# Patient Record
Sex: Male | Born: 2000 | Race: Black or African American | Hispanic: No | Marital: Single | Smoking: Never smoker
Health system: Southern US, Community
[De-identification: ages and names within clinical notes are randomized; demographics above are authoritative.]

## PROBLEM LIST (undated history)

## (undated) DIAGNOSIS — R519 Headache, unspecified: Secondary | ICD-10-CM

## (undated) HISTORY — DX: Headache, unspecified: R51.9

---
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
## 2022-01-02 ENCOUNTER — Ambulatory Visit (INDEPENDENT_AMBULATORY_CARE_PROVIDER_SITE_OTHER): Payer: 59 | Admitting: Family

## 2022-01-02 ENCOUNTER — Encounter (INDEPENDENT_AMBULATORY_CARE_PROVIDER_SITE_OTHER): Payer: Self-pay | Admitting: Family

## 2022-01-02 VITALS — BP 120/60 | HR 60 | Ht 68.7 in | Wt 177.0 lb

## 2022-01-02 DIAGNOSIS — G43009 Migraine without aura, not intractable, without status migrainosus: Secondary | ICD-10-CM | POA: Diagnosis not present

## 2022-01-02 MED ORDER — SUMATRIPTAN SUCCINATE 25 MG PO TABS: ORAL_TABLET | ORAL | 5 refills | Status: DC

## 2022-01-02 NOTE — Patient Instructions (Signed)
It was a pleasure to see you today!  Instructions for you until your next appointment are as follows: Remember that it is important for you to avoid skipping meals, to drink plenty of water each day and to get at least 9 hours of sleep each night as these things are known to reduce how often headaches occur.   Continue to take Sumatriptan and Tylenol at the onset of migraine pain for best results.  Please sign up for MyChart if you have not done so. Please plan to return for follow up in 6 months or sooner if needed.  Feel free to contact our office during normal business hours at [phone] with questions or concerns. If there is no answer or the call is outside business hours, please leave a message and our clinic staff will call you back within the next business day.  If you have an urgent concern, please stay on the line for our after-hours answering service and ask for the on-call neurologist.     I also encourage you to use MyChart to communicate with me more directly. If you have not yet signed up for MyChart within [facility], the front desk staff can help you. However, please note that this inbox is NOT monitored on nights or weekends, and response can take up to 2 business days.  Urgent matters should be discussed with the on-call pediatric neurologist.   At [facility], we are committed to providing exceptional care. You will receive a patient satisfaction survey through text or email regarding your visit today. Your opinion is important to me. Comments are appreciated.

## 2022-01-02 NOTE — Progress Notes (Signed)
Larry Ramos   MRN:  [id number]  2000-03-06   Provider: Rockwell Germany NP-C Location of Care: [facility]  Visit type: Return visit  Last visit: 12/14/2021  Referral source: Normajean Baxter, MD History from: Epic chart and patient  Brief history:  Copied from previous record: History of migraine headaches since about 2020. With the headaches he has severe unilateral head pain, nausea and vomiting, and intolerance to light and sound. 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 Doing well academically, has transitioned well to college Migraines not problematic. When they occur, Sumatriptan works well to give him relief.  Works at not skipping meals, drinking water, and staying on a sleep schedule.  Bray has been otherwise generally healthy since he was last seen. No health concerns today other than previously mentioned.  Review of systems: Please see HPI for neurologic and other pertinent review of systems. Otherwise all other systems were reviewed and were negative.  Problem List: Patient Active Problem List   Diagnosis Date Noted   Migraine without aura and without status migrainosus, not intractable 05/19/2020   Sprain of right ankle, sequela 03/20/2017     Past Medical History:  Diagnosis Date   Headache     Past medical history comments: See HPI Copied from previous record: Birth history: Azad was born at term via normal spontaneous delivery weighing 5lbs 1oz at [facility] in [place]. There were no complications of pregnancy, labor or delivery. Development was recalled as normal.   Surgical history: Past Surgical History:  Procedure Laterality Date   wisdom teeth removal  03/31/2018     Family history: family history includes Migraines in his father and paternal grandfather.   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    He enjoys sports (basketball and football) playing video games, and drawing.    Social Determinants of Health   Financial Resource Strain: Not on file  Food Insecurity: Not on file  Transportation Needs: Not on file  Physical Activity: Not on file  Stress: Not on file  Social Connections: Not on file  Intimate Partner Violence: Not on file   Past/failed meds: Copied from previous record: Topiramate  - stopped it when headaches improved Clonidine - stopped it when sleep improved  Allergies: No Known Allergies   Immunizations: Immunization History  Administered Date(s) Administered   PFIZER(Purple Top)SARS-COV-2 Vaccination 06/20/2019, 07/20/2019    Diagnostics/Screenings: Copied from previous record: PHQ-SADS Score Only 05/19/2020  PHQ-15 5  GAD-7 3  Anxiety attacks No  PHQ-9 2  Suicidal Ideation No  Any difficulty  to complete tasks? Not difficult at all   Physical Exam: BP 120/60   Pulse 60   Ht 5' 8.7" (1.745 m)   Wt 177 lb 0.5 oz (80.3 kg)   BMI 26.37 kg/m   General: Well developed, well nourished young man, seated on exam table, in no evident distress Head: Head normocephalic and atraumatic.  Oropharynx benign. Neck: Supple Cardiovascular: Regular rate and rhythm, no murmurs Respiratory: Breath sounds clear to  auscultation Musculoskeletal: No obvious deformities or scoliosis Skin: No rashes or neurocutaneous lesions  Neurologic Exam Mental Status: Awake and fully alert.  Oriented to place and time.  Recent and remote memory intact.  Attention span, concentration, and fund of knowledge appropriate.  Mood and affect appropriate. Cranial Nerves: Fundoscopic exam reveals sharp disc margins.  Pupils equal, briskly reactive to light.  Extraocular movements full without nystagmus. Hearing intact and symmetric to whisper.  Facial sensation intact.  Face tongue, palate move normally and symmetrically. Shoulder shrug normal Motor: Normal bulk and tone. Normal strength in all tested extremity muscles. Sensory: Intact to touch and temperature in all extremities.  Coordination: Rapid alternating movements normal in all extremities.  Finger-to-nose and heel-to shin performed accurately bilaterally.  Romberg negative. Gait and Station: Arises from chair without difficulty.  Stance is normal. Gait demonstrates normal stride length and balance.   Able to heel, toe and tandem walk without difficulty. Reflexes: 1+ and symmetric. Toes downgoing.   Impression: Migraine without aura and without status migrainosus, not intractable - Plan: SUMAtriptan (IMITREX) 25 MG tablet   Recommendations for plan of care: The patient's previous Epic records were reviewed. Recent diagnostic studies were reviewed with the patient.  Plan until next visit: Continue medications as prescribed  Reminded to avoid skipping meals, to drink plenty of water each day, and to stay on a sleep schedule Call if headaches become more frequent or more severe Return in about 6 months (around 07/03/2022).  The medication list was reviewed and reconciled. No changes were made in the prescribed medications today. A complete medication list was provided to the patient.  Allergies as of 01/02/2022   No Known Allergies      Medication List         Accurate as of January 02, 2022  3:31 PM. If you have any questions, ask your nurse or doctor.          STOP taking these medications    ciclopirox 8 % solution Commonly known as: Penlac Stopped by: Rockwell Germany, NP   clotrimazole-betamethasone cream Commonly known as: Lotrisone Stopped by: Rockwell Germany, NP       TAKE these medications    ondansetron 4 MG tablet Commonly known as: ZOFRAN Take 1 tablet (4 mg total) by mouth every 8 (eight) hours as needed for nausea or vomiting.   SUMAtriptan 25 MG tablet Commonly known as: IMITREX Take 1 tablet at onset of migraine along with Ibuprofen 400mg . If the migraine is still present in 2 hours, take 1 more tablet.      Total time spent with the patient was 20 minutes, of which 50% or more was spent in counseling and coordination of care.  Rockwell Germany NP-C  Child Neurology and [facility] [address] [place], [place] [address] Ph. [phone] Fax [phone]

## 2022-04-07 IMAGING — MR MR ANKLE*R* W/O CM
4 of 5 series · 13 of 40 positions shown · non-contrast
Comparison: Right ankle x-rays dated January 19, 2020.

CLINICAL DATA: Persistent lateral ankle pain since twisting injury
a month and a half ago.

EXAM:
MRI OF THE RIGHT ANKLE WITHOUT CONTRAST
TECHNIQUE: Multiplanar, multisequence MR imaging of the ankle was performed. No
intravenous contrast was administered.

[Series 3: PD fat-sat · axial · right · 3.0mm · 0.25mm/px · z∈[-119,-19]mm · 4 of 30 slices shown]
[im 1/30]
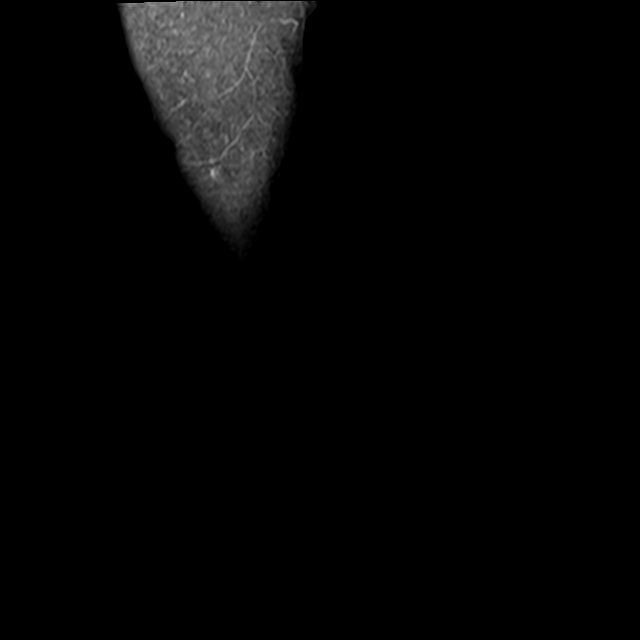
[im 4/30]
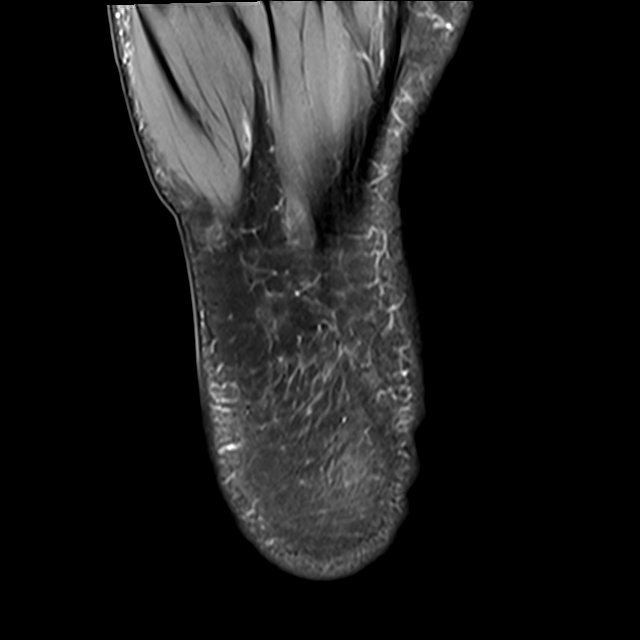
[im 15/30]
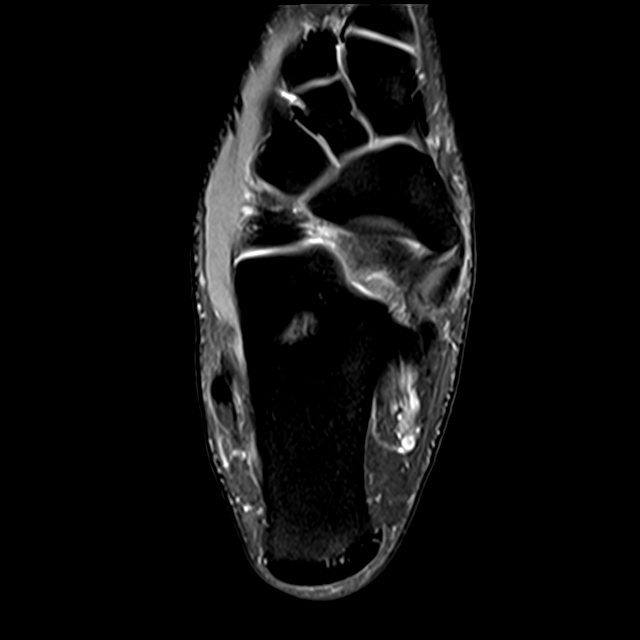
[im 26/30]
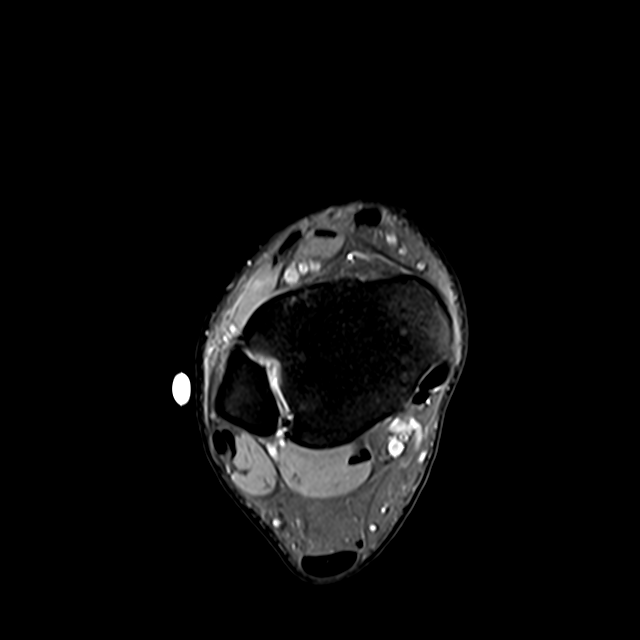

[Series 4: T2 fat-sat · axial · right · 3.0mm · 0.25mm/px · z∈[-107,-19]mm · 3 of 30 slices shown (1 of 2)]
[im 4/30]
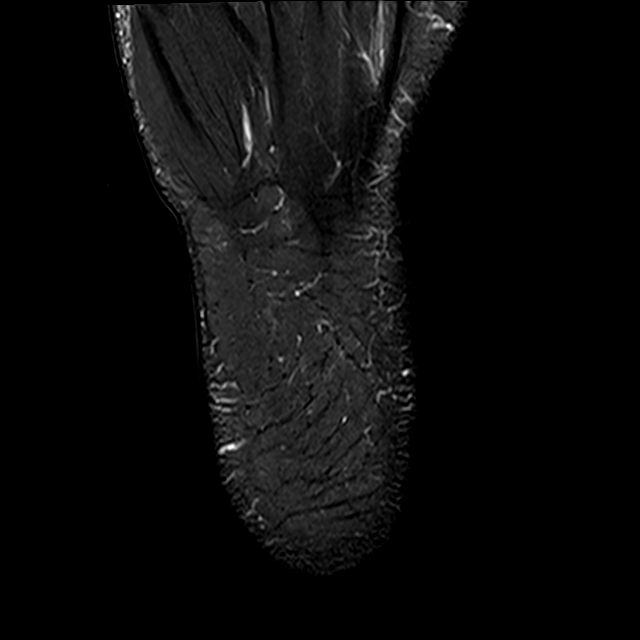
[im 15/30]
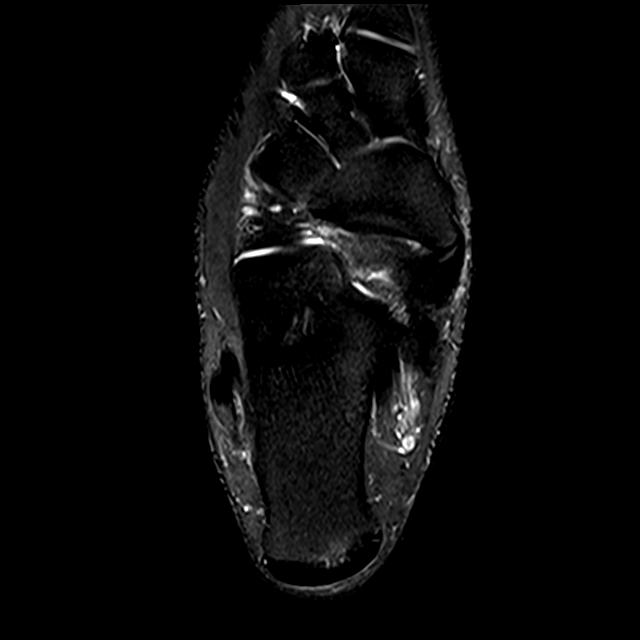
[im 26/30]
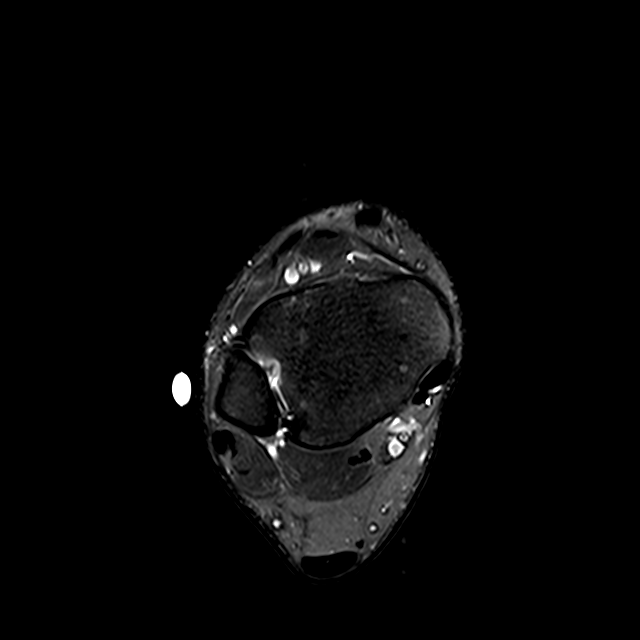

[Series 5: T1 · sagittal · right · 4.0mm · 0.27mm/px · 3 of 24 slices shown]
[im 4/24]
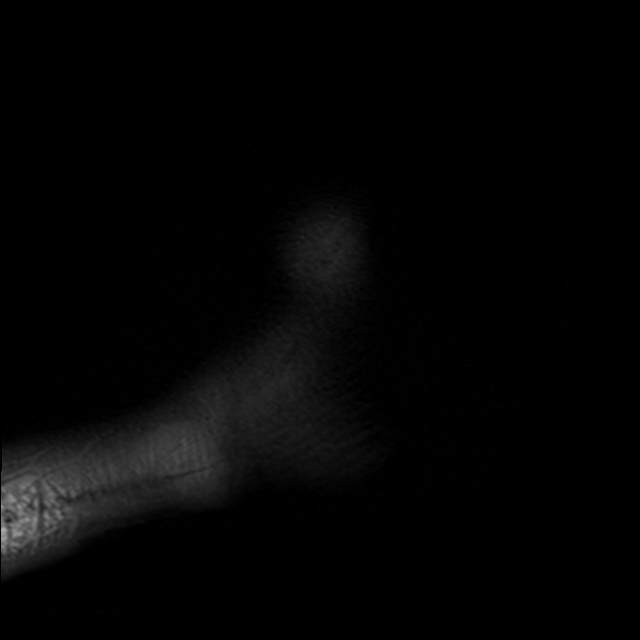
[im 12/24]
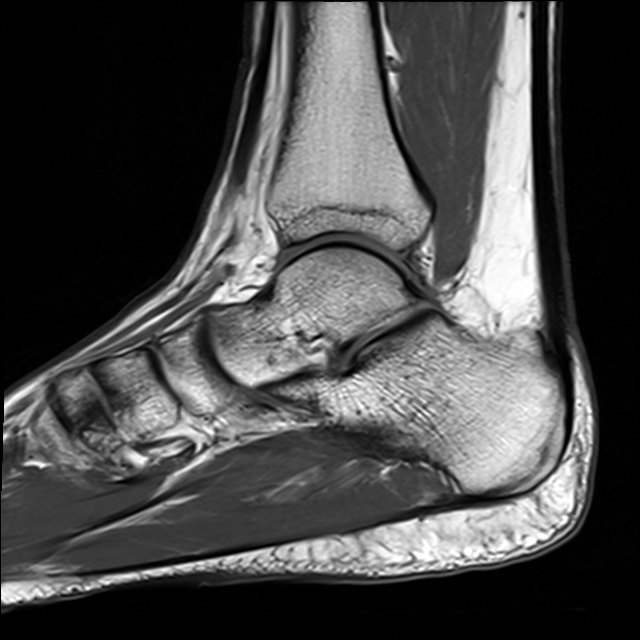
[im 20/24]
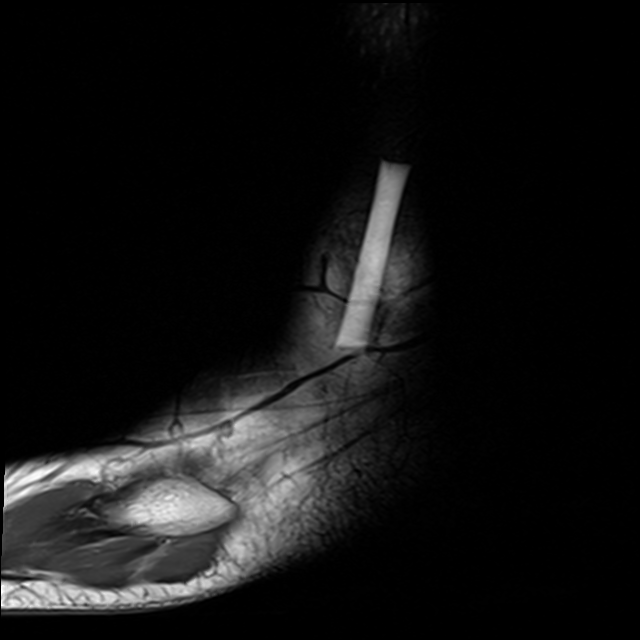

[Series 7: T2 fat-sat · coronal · right · 3.0mm · 0.25mm/px · 3 of 33 slices shown (2 of 2)]
[im 5/33]
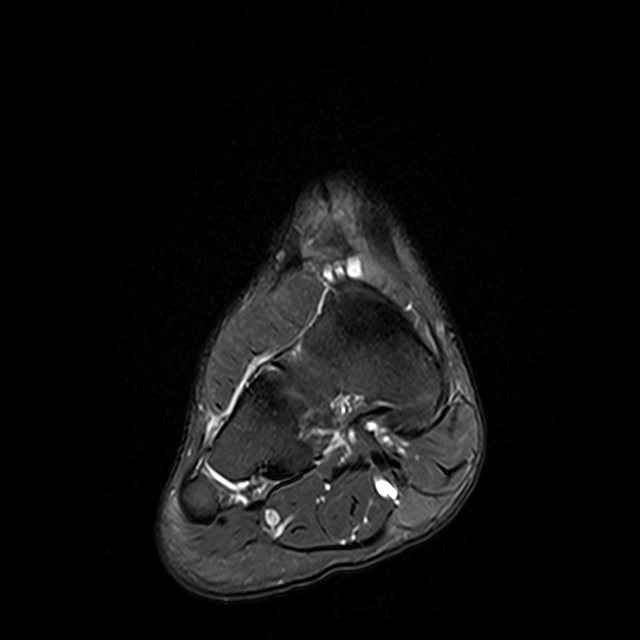
[im 17/33]
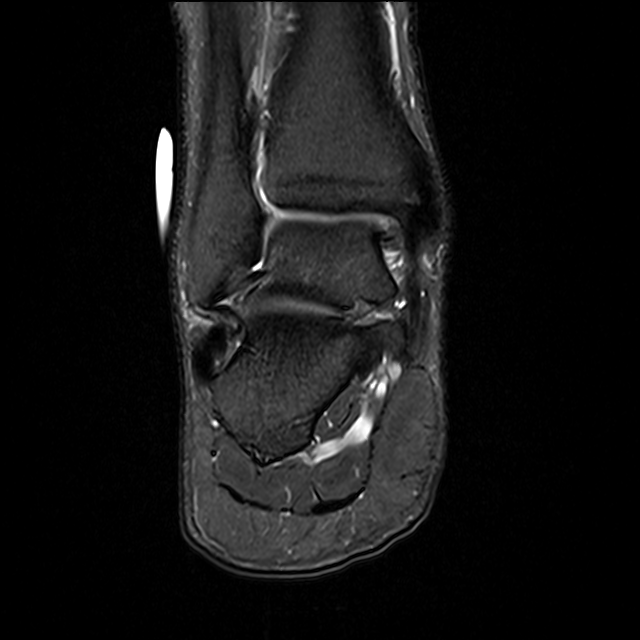
[im 29/33]
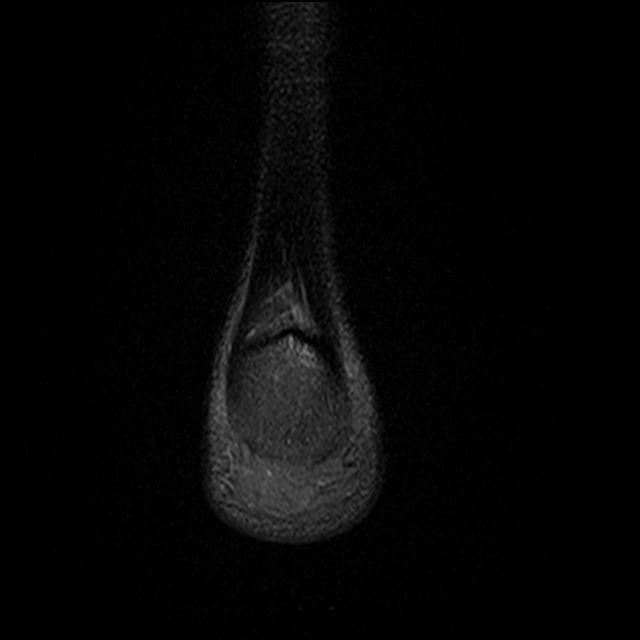

[13 of 40 positions shown; findings below may reference images not displayed]

FINDINGS: TENDONS

Peroneal: Peroneal longus tendon intact. Peroneal brevis intact.

Posteromedial: Posterior tibial tendon intact. Flexor digitorum
longus tendon intact. Flexor hallucis longus tendon intact.

Anterior: Tibialis anterior tendon intact. Extensor hallucis longus
tendon intact Extensor digitorum longus tendon intact.

Achilles:  Intact.

Plantar Fascia: Intact.

LIGAMENTS

Lateral: Anterior talofibular ligament intact. Calcaneofibular
ligament intact. Posterior talofibular ligament intact. Anterior
inferior tibiofibular ligament is mildly thickened but intact.
Posterior inferior tibiofibular ligament intact.

Medial: Deltoid ligament intact. Spring ligament intact.

CARTILAGE

Ankle Joint: No joint effusion. Normal ankle mortise. No chondral
defect.

Subtalar Joints/Sinus Tarsi: Normal subtalar joints. No subtalar
joint effusion. Normal sinus tarsi.

Bones: No marrow signal abnormality.  No fracture or dislocation.

Soft Tissue: No soft tissue mass or fluid collection.
IMPRESSION: 1. Mild thickening of the anterior inferior tibiofibular ligament,
consistent with prior sprain.

## 2022-07-10 ENCOUNTER — Ambulatory Visit (INDEPENDENT_AMBULATORY_CARE_PROVIDER_SITE_OTHER): Payer: Self-pay | Admitting: Family

## 2022-10-06 ENCOUNTER — Other Ambulatory Visit (INDEPENDENT_AMBULATORY_CARE_PROVIDER_SITE_OTHER): Payer: Self-pay | Admitting: Family

## 2022-10-06 DIAGNOSIS — G43009 Migraine without aura, not intractable, without status migrainosus: Secondary | ICD-10-CM

## 2022-10-08 NOTE — Telephone Encounter (Signed)
Attempted to call and schedule an appointment.  Patient unable to be reached. LVM to call back.  SS, CCMA

## 2023-01-17 ENCOUNTER — Ambulatory Visit (INDEPENDENT_AMBULATORY_CARE_PROVIDER_SITE_OTHER): Payer: 59 | Admitting: Family

## 2023-02-11 ENCOUNTER — Ambulatory Visit (INDEPENDENT_AMBULATORY_CARE_PROVIDER_SITE_OTHER): Payer: 59 | Admitting: Family

## 2023-02-11 ENCOUNTER — Encounter (INDEPENDENT_AMBULATORY_CARE_PROVIDER_SITE_OTHER): Payer: Self-pay | Admitting: Family

## 2023-02-11 VITALS — BP 118/80 | HR 80 | Ht 70.0 in | Wt 173.4 lb

## 2023-02-11 DIAGNOSIS — G43009 Migraine without aura, not intractable, without status migrainosus: Secondary | ICD-10-CM | POA: Diagnosis not present

## 2023-02-11 MED ORDER — SUMATRIPTAN SUCCINATE 25 MG PO TABS: ORAL_TABLET | ORAL | 5 refills | Status: DC

## 2023-02-11 MED ORDER — ONDANSETRON HCL 4 MG PO TABS: 4.0000 mg | ORAL_TABLET | Freq: Three times a day (TID) | ORAL | 5 refills | Status: DC | PRN

## 2023-02-11 NOTE — Progress Notes (Signed)
 Larry Ramos   MRN:  [id number]  November 10, 2000   Provider: Lyndol Santee NP-C Location of Care: [facility]  Visit type: Return visit  Last visit: 01/02/2022  Referral source: Annabell Key, MD History from: Epic chart and patient  Brief history:  Copied from previous record: History of migraine headaches since about 2020. With the headaches he has severe unilateral head pain, nausea and vomiting, and intolerance to light and sound. Sumatriptan  gives him relief within about 20 minutes to 1 hour in most cases.  Today's concerns: He reports today that he has had more headaches over the last few months. He believes that they are triggered by not eating enough. 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  He reports that the headaches are generally annoying and respond well to having something to eat and drink, and resting for a short time. In addition, he sometimes takes Tylenol to obtain relief.  When headaches are more severe, Sumatriptan  usually gives him relief.  Larry Ramos is doing well academically and made the Chancellor's list last semester. He is looking forward to an internship at [employer] this semester. 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  Larry Ramos has been otherwise generally healthy since he was last seen. No health concerns today other than previously mentioned.  Review of systems: Please see HPI for neurologic and other pertinent review of systems. Otherwise all other systems were reviewed and were negative.  Problem List: Patient Active Problem List   Diagnosis Date Noted   Migraine without aura and without status migrainosus, not intractable 05/19/2020   Sprain of right ankle, sequela 03/20/2017     Past Medical History:  Diagnosis Date   Headache     Past medical history comments: See HPI Copied from previous record: Birth  history: Larry Ramos was born at term via normal spontaneous delivery weighing 5lbs 1oz at [facility] in [place]. There were no complications of pregnancy, labor or delivery. Development was recalled as normal.   Surgical history: Past Surgical History:  Procedure Laterality Date   wisdom teeth removal  03/31/2018    Family history: family history includes Migraines in his father and paternal grandfather.   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    He enjoys sports (basketball and football) playing video games, and drawing.    Social Drivers of Corporate investment banker Strain: Not on file  Food Insecurity: Not on file  Transportation Needs: Not on file  Physical Activity: Not on file  Stress: Not  on file  Social Connections: Not on file  Intimate Partner Violence: Not on file    Past/failed meds: Copied from previous record: Topiramate   - stopped it when headaches improved Clonidine  - stopped it when sleep improved  Allergies: No Known Allergies   Immunizations: Immunization History  Administered Date(s) Administered   PFIZER(Purple Top)SARS-COV-2 Vaccination 06/20/2019, 07/20/2019    Diagnostics/Screenings: Copied from previous record: PHQ-SADS Score Only 05/19/2020  PHQ-15 5  GAD-7 3  Anxiety attacks No   PHQ-9 2  Suicidal Ideation No  Any difficulty to complete tasks? Not difficult at al   Physical Exam: BP 118/80   Pulse 80   Ht 5\' 10"  (1.778 m)   Wt 173 lb 6.4 oz (78.7 kg)   BMI 24.88 kg/m   General: Well developed, well nourished young man, seated on exam table, in no evident distress Head: Head normocephalic and atraumatic.  Oropharynx benign. Neck: Supple Cardiovascular: Regular rate and rhythm, no murmurs Respiratory: Breath sounds clear to auscultation Musculoskeletal: No obvious deformities or scoliosis Skin: No rashes or neurocutaneous lesions  Neurologic Exam Mental Status: Awake and fully alert.  Oriented to place and time.  Recent and remote memory intact.  Attention span, concentration, and fund of knowledge appropriate.  Mood and affect appropriate. Cranial Nerves: Fundoscopic exam reveals sharp disc margins.  Pupils equal, briskly reactive to light.  Extraocular movements full without nystagmus. Hearing intact and symmetric to whisper.  Facial sensation intact.  Face tongue, palate move normally and symmetrically. Shoulder shrug normal Motor: Normal bulk and tone. Normal strength in all tested extremity muscles. Sensory: Intact to touch and temperature in all extremities.  Coordination: Rapid alternating movements normal in all extremities.  Finger-to-nose and heel-to shin performed accurately bilaterally.  Romberg negative. Gait and Station: Arises from chair without difficulty.  Stance is normal. Gait demonstrates normal stride length and balance.   Able to heel, toe and tandem walk without difficulty. Reflexes: 1+ and symmetric. Toes downgoing.   Impression: Migraine without aura and without status migrainosus, not intractable - Plan: ondansetron  (ZOFRAN ) 4 MG tablet, SUMAtriptan  (IMITREX ) 25 MG tablet   Recommendations for plan of care: The patient's previous Epic records were reviewed. No recent diagnostic studies to be reviewed with the patient. We talked about  ways for him to eat more during the day with his busy schedule with regular meals and snacks. Plan until next visit: Continue medications as prescribed  Call if headache frequency does not improve, or for other questions or concerns Return in about 1 year (around 02/11/2024).  The medication list was reviewed and reconciled. No changes were made in the prescribed medications today. A complete medication list was provided to the patient.  Allergies as of 02/11/2023   No Known Allergies      Medication List        Accurate as of February 11, 2023  7:09 PM. If you have any questions, ask your nurse or doctor.          ondansetron  4 MG tablet Commonly known as: ZOFRAN  Take 1 tablet (4 mg total) by mouth every 8 (eight) hours as needed for nausea or vomiting.   SUMAtriptan  25 MG tablet Commonly known as: IMITREX  Take 1 tablet at onset of migraine along with Ibuprofen 400mg . If the migraine is still present in 2 hours, take 1 more tablet.      Total time spent with the patient was 25 minutes, of which 50% or more was spent in counseling and coordination of  care.  Lyndol Santee NP-C [facility] [address] [place], [place] [address] Ph. [phone] Fax [phone]

## 2023-02-11 NOTE — Patient Instructions (Signed)
 It was a pleasure to see you today!  Instructions for you until your next appointment are as follows: Work on eating more during the day Let me know if the headaches do not improve with having more to eat during the day. Remember that it is also important for you to drink plenty of water each day and to get at least 8 hours of sleep each night as these things are known to reduce how often headaches occur.   Take Sumatriptan  and Ondansetron  as soon as you realize the migraine is present for it to be most effective. Please sign up for MyChart if you have not done so. Please plan to return for follow up in 1 year or sooner if needed.  Feel free to contact our office during normal business hours at [phone] with questions or concerns. If there is no answer or the call is outside business hours, please leave a message and our clinic staff will call you back within the next business day.  If you have an urgent concern, please stay on the line for our after-hours answering service and ask for the on-call neurologist.     I also encourage you to use MyChart to communicate with me more directly. If you have not yet signed up for MyChart within [facility], the front desk staff can help you. However, please note that this inbox is NOT monitored on nights or weekends, and response can take up to 2 business days.  Urgent matters should be discussed with the on-call pediatric neurologist.   At [facility], we are committed to providing exceptional care. You will receive a patient satisfaction survey through text or email regarding your visit today. Your opinion is important to me. Comments are appreciated.

## 2023-04-28 ENCOUNTER — Other Ambulatory Visit (INDEPENDENT_AMBULATORY_CARE_PROVIDER_SITE_OTHER): Payer: Self-pay | Admitting: Family

## 2023-04-28 DIAGNOSIS — G43009 Migraine without aura, not intractable, without status migrainosus: Secondary | ICD-10-CM

## 2023-07-28 ENCOUNTER — Other Ambulatory Visit (INDEPENDENT_AMBULATORY_CARE_PROVIDER_SITE_OTHER): Payer: Self-pay | Admitting: Family

## 2023-07-28 DIAGNOSIS — G43009 Migraine without aura, not intractable, without status migrainosus: Secondary | ICD-10-CM

## 2024-02-04 ENCOUNTER — Ambulatory Visit (INDEPENDENT_AMBULATORY_CARE_PROVIDER_SITE_OTHER): Payer: 59 | Admitting: Family

## 2024-02-04 ENCOUNTER — Encounter (INDEPENDENT_AMBULATORY_CARE_PROVIDER_SITE_OTHER): Payer: Self-pay | Admitting: Family

## 2024-02-04 VITALS — BP 112/60 | HR 52 | Ht 69.09 in | Wt 172.2 lb

## 2024-02-04 DIAGNOSIS — G43009 Migraine without aura, not intractable, without status migrainosus: Secondary | ICD-10-CM

## 2024-02-04 MED ORDER — SUMATRIPTAN SUCCINATE 25 MG PO TABS: ORAL_TABLET | ORAL | 5 refills | Status: AC

## 2024-02-04 MED ORDER — ONDANSETRON HCL 4 MG PO TABS: 4.0000 mg | ORAL_TABLET | Freq: Three times a day (TID) | ORAL | 5 refills | Status: AC | PRN

## 2024-02-04 NOTE — Patient Instructions (Signed)
 It was a pleasure to see you today!  Instructions for you until your next appointment are as follows: Remember that it is important for you to avoid skipping meals, to drink plenty of water each day and to get at least 8 hours of sleep each night as these things are known to reduce how often headaches occur.   Let me know if headaches become more frequent or more severe I will write a letter for car window tinting if needed Please sign up for MyChart if you have not done so. Please plan to return for follow up in 1 year or sooner if needed.  Feel free to contact our office during normal business hours at [phone] with questions or concerns. If there is no answer or the call is outside business hours, please leave a message and our clinic staff will call you back within the next business day.  If you have an urgent concern, please stay on the line for our after-hours answering service and ask for the on-call neurologist.     I also encourage you to use MyChart to communicate with me more directly. If you have not yet signed up for MyChart within [facility], the front desk staff can help you. However, please note that this inbox is NOT monitored on nights or weekends, and response can take up to 2 business days.  Urgent matters should be discussed with the on-call pediatric neurologist.   At [facility], we are committed to providing exceptional care. You will receive a patient satisfaction survey through text or email regarding your visit today. Your opinion is important to me. Comments are appreciated.

## 2025-02-03 ENCOUNTER — Ambulatory Visit (INDEPENDENT_AMBULATORY_CARE_PROVIDER_SITE_OTHER): Payer: Self-pay | Admitting: Family
